# Patient Record
Sex: Male | Born: 1975 | Race: White | Hispanic: No | Marital: Married | Smoking: Never smoker
Health system: Southern US, Community
[De-identification: ages and names within clinical notes are randomized; demographics above are authoritative.]

## PROBLEM LIST (undated history)

## (undated) DIAGNOSIS — K4090 Unilateral inguinal hernia, without obstruction or gangrene, not specified as recurrent: Secondary | ICD-10-CM

## (undated) DIAGNOSIS — E785 Hyperlipidemia, unspecified: Secondary | ICD-10-CM

## (undated) HISTORY — PX: TONSILLECTOMY AND ADENOIDECTOMY: SUR1326

## (undated) HISTORY — PX: OTHER SURGICAL HISTORY: SHX169

## (undated) HISTORY — DX: Hyperlipidemia, unspecified: E78.5

---

## 2001-01-12 ENCOUNTER — Ambulatory Visit (HOSPITAL_COMMUNITY): Admission: RE | Admit: 2001-01-12 | Discharge: 2001-01-13 | Payer: Self-pay | Admitting: Orthopedic Surgery

## 2002-12-07 ENCOUNTER — Emergency Department (HOSPITAL_COMMUNITY): Admission: EM | Admit: 2002-12-07 | Discharge: 2002-12-07 | Payer: Self-pay | Admitting: Emergency Medicine

## 2004-03-12 ENCOUNTER — Inpatient Hospital Stay (HOSPITAL_COMMUNITY): Admission: AD | Admit: 2004-03-12 | Discharge: 2004-03-15 | Payer: Self-pay | Admitting: Internal Medicine

## 2004-03-12 ENCOUNTER — Encounter: Payer: Self-pay | Admitting: *Deleted

## 2005-01-31 ENCOUNTER — Encounter: Admission: RE | Admit: 2005-01-31 | Discharge: 2005-01-31 | Payer: Self-pay | Admitting: *Deleted

## 2005-06-18 ENCOUNTER — Ambulatory Visit (HOSPITAL_BASED_OUTPATIENT_CLINIC_OR_DEPARTMENT_OTHER): Admission: RE | Admit: 2005-06-18 | Discharge: 2005-06-18 | Payer: Self-pay | Admitting: Orthopedic Surgery

## 2005-11-25 IMAGING — CR DG CHEST 2V
2 series · 2 of 2 positions shown · non-contrast
Comparison: 03/13/04.

CLINICAL DATA: 28 year-old male, viral meningitis, weakness and nausea.
 CHEST-TWO VIEW:

[view not recorded (1 of 2)]
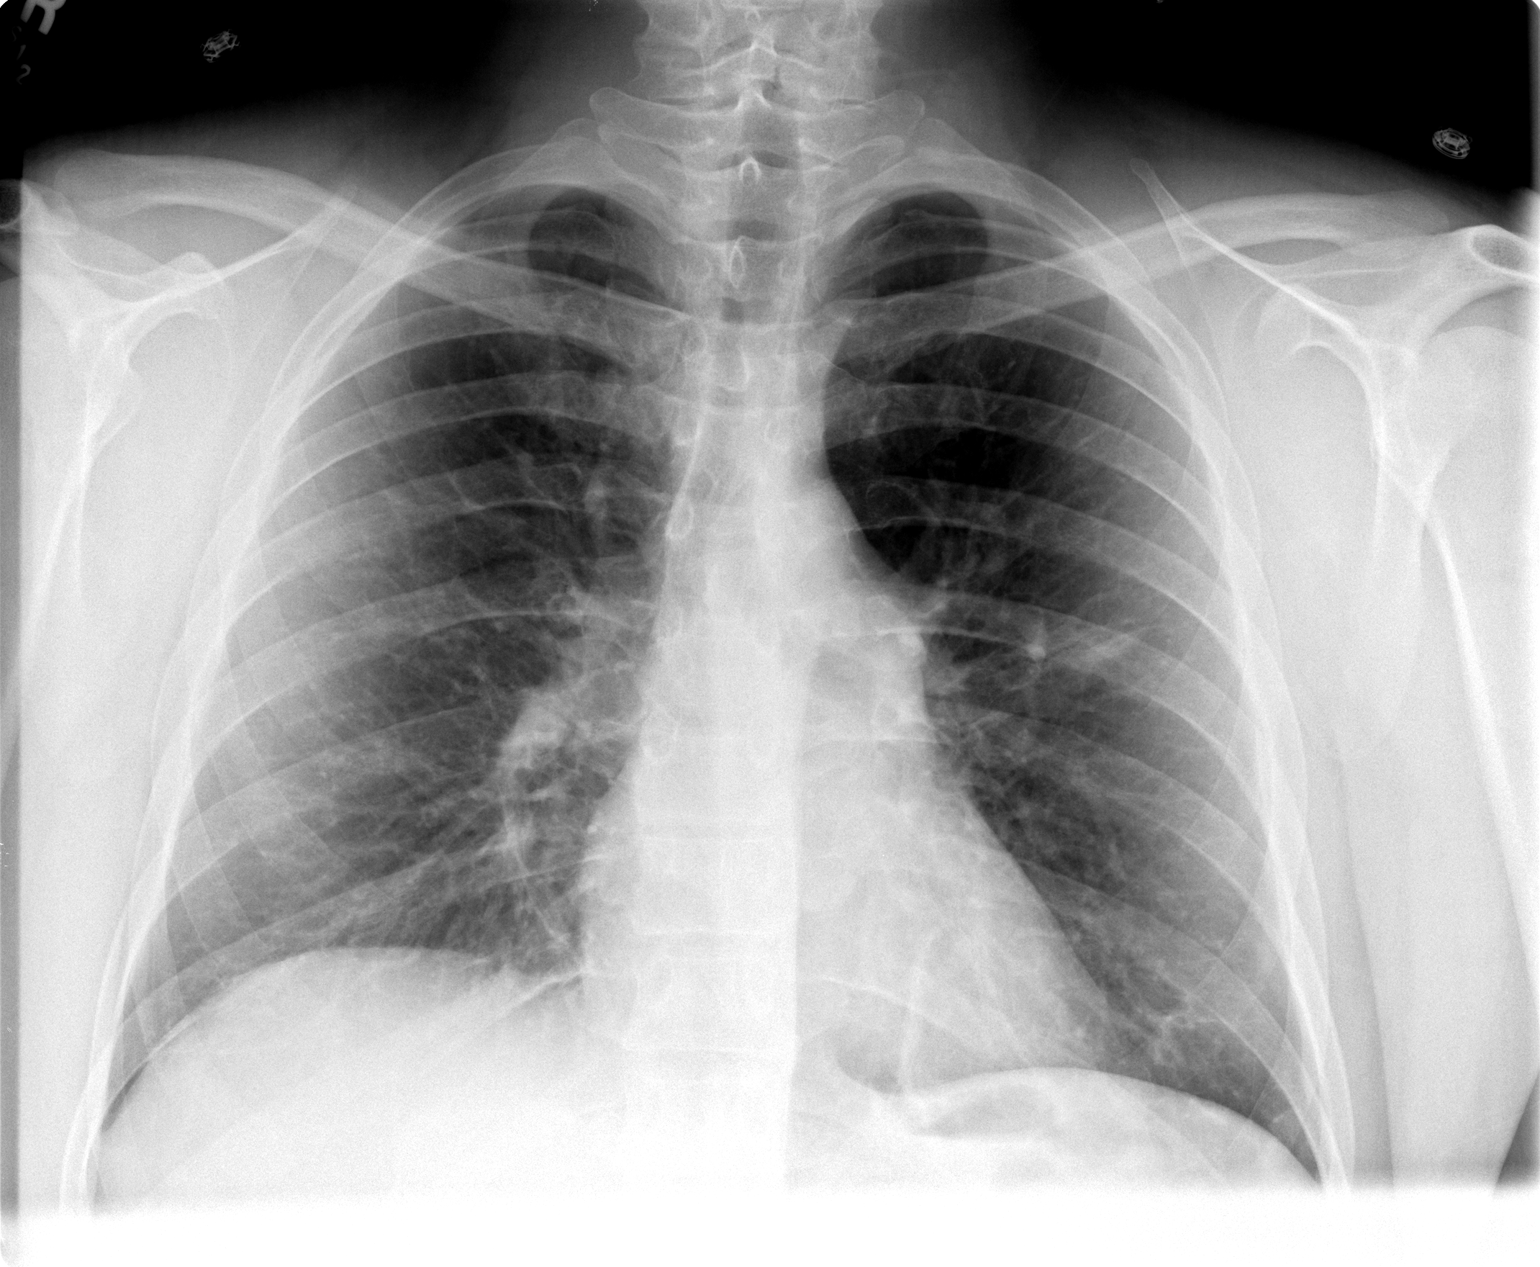

[view not recorded (2 of 2)]
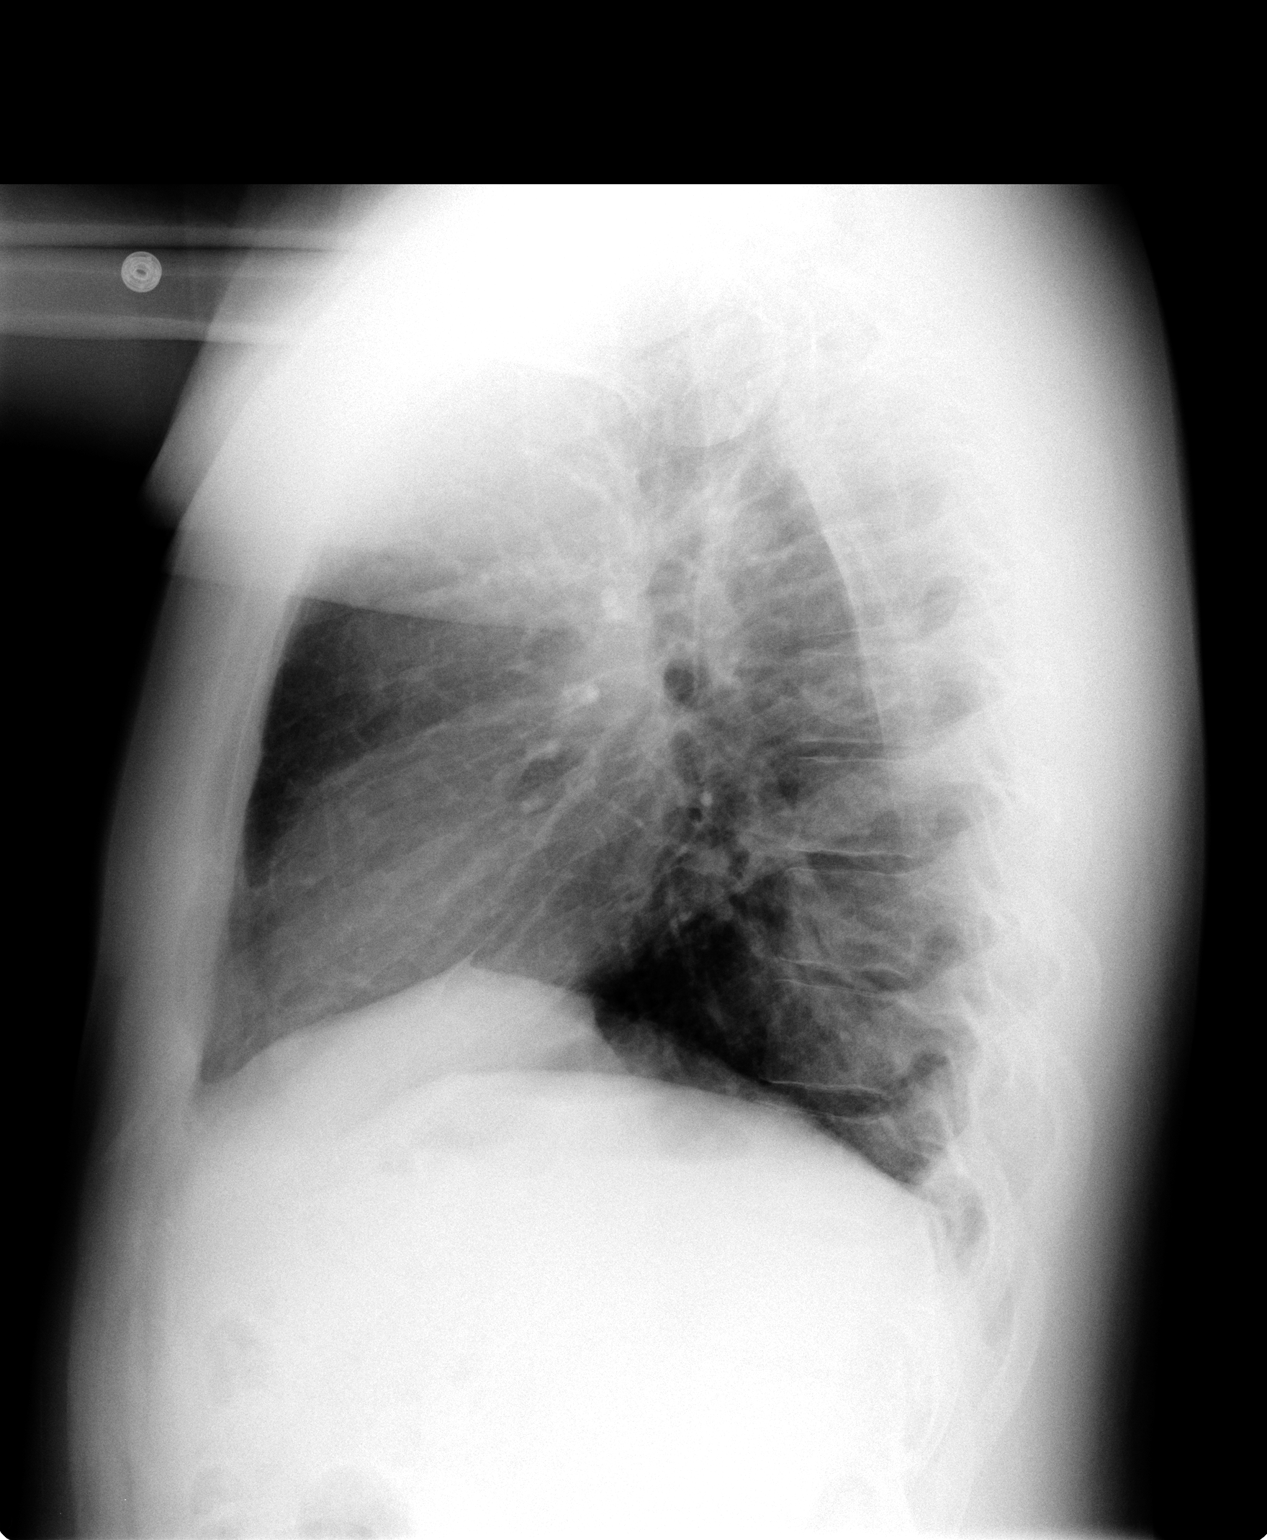

[2 of 2 positions shown; findings below may reference images not displayed]

FINDINGS: There is improved aeration of the right upper lobe and previous focal airspace disease. There is slight increase in left perihilar and left lower lobe atelectasis. No effusion, edema, or pneumothorax. Heart size is normal.
IMPRESSION: 1.  Resolving right upper lobe airspace disease.
 2.  Increasing left perihilar and lower lobe atelectasis.

## 2007-05-26 ENCOUNTER — Ambulatory Visit (HOSPITAL_BASED_OUTPATIENT_CLINIC_OR_DEPARTMENT_OTHER): Admission: RE | Admit: 2007-05-26 | Discharge: 2007-05-26 | Payer: Self-pay | Admitting: Orthopedic Surgery

## 2010-07-23 NOTE — Op Note (Signed)
Ryan Landry, Ryan Landry              ACCOUNT NO.:  [id number]   MEDICAL RECORD NO.:  [id number]          PATIENT TYPE:  AMB   LOCATION:  DSC                          FACILITY:  [facility]   PHYSICIAN:  Matthew A. Weingold, M.D.DATE OF BIRTH:  1975/07/31   DATE OF PROCEDURE:  05/26/2007  DATE OF DISCHARGE:                               OPERATIVE REPORT   PREOPERATIVE DIAGNOSIS:  Chronic right carpal tunnel syndrome.   POSTOPERATIVE DIAGNOSIS:  Chronic right carpal tunnel syndrome.   PROCEDURE:  Right carpal tunnel release.   SURGEON:  Artist Pais. Mina Marble, M.D.   ASSISTANT:  None.   ANESTHESIA:  General.   TOURNIQUET TIME:  16 minutes.   COMPLICATIONS:  None.   DRAINS:  None.   DESCRIPTION OF PROCEDURE:  The patient was taken to the operating room.  After induction of adequate general anesthesia the right upper extremity  was prepped and draped in the usual sterile fashion.  An Esmarch was  used to exsanguinate the limb.  The tourniquet was inflated to 250 mmHg.  At this point in time a 2 cm incision was made in the palmar aspect of  the right hand in line with the long finger metacarpal, starting at  Kaplan's cardinal line.  Skin was incised.  Palmar fascia was identified  and split.  The distal edge of the transverse carpal ligament was  identified, split with a 15 blade.  The median nerve was identified and  protected with a Therapist, nutritional.  Remaining aspects of the transverse  carpal ligament were then divided under direct vision using curved blunt  scissors.  The canal was inspected.  There were no osseous lesions or  ganglions present.  We irrigated and loosely closed with 3-0 Prolene  subcuticular stitch.  Steri-Strips, 4x4s, fluffs, and dry dressing was  applied.  The patient tolerated the procedure well.      Artist Pais Mina Marble, M.D.  Electronically Signed     MAW/MEDQ  D:  05/26/2007  T:  05/26/2007  Job:  [id number]

## 2010-07-26 NOTE — Op Note (Signed)
[place]  Patient:    DECORIAN, SCHUENEMANN Visit Number: [id number] MRN: [id number]          Service Type: MED Location: [id number] [phone] Attending Physician:  Burnard Bunting Dictated by:   Cammy Copa, M.D. Proc. Date: 01/12/01 Admit Date:  01/12/2001 Discharge Date: 01/13/2001                             Operative Report  PREOPERATIVE DIAGNOSIS:  Foreign body, right hand.  POSTOPERATIVE DIAGNOSIS:  Foreign body, right hand.  PROCEDURE:  Removal of foreign body and debridement of early infection.  SURGEON:  Cammy Copa, M.D.  ANESTHESIA:  General endotracheal.  ESTIMATED BLOOD LOSS:  3 cc.  DRAINS:  Vessel loop x 1.  CULTURES:  Tip of splinter is sent for culture.  TOURNIQUET TIME:  20 minutes at 250 mmHg.  PROCEDURE IN DETAIL:  This patient was brought to the operating room where general endotracheal anesthesia was induced.  The hand was prescrubbed with Hibiclens and then prepped with Hibiclens and saline in a sterile manner.  Arm was elevated but not exsanguinated, and the tourniquet was then inflated.  The entry portal of the splinter was in the second web space.  An oblique 3-cm incision was then made along the palpable course of the splinter.  Skin and subcutaneous tissue were sharply divided.  The tip of the splinter was noted to be recessed about 4-5 mm beneath the surface of the skin.  The tip of the splinter was visualized.  The skin on top of the splinter was then incised for the length of the splinter which was about 3 cm.  Careful dissection was performed.  The splinter remained superficial to the palmar fascia.  Skin and subcutaneous tissue was divided along the length of the splinter.  The neurovascular bundles were not involved nor were they at risk in the blunt dissection.  After splinter removal, the tip, which was the most penetrated end to the skin, was sent for culture.  Incision was then  thoroughly irrigated.  Early infected tissue including the skin, subcutaneous tissue, and some of the superficial fascia was debrided.  The splinter did not involve the tendon sheaths.  Following irrigation, a vessel loop was placed along the length of the incision which was then loosely reapproximated with two 3-0 nylon sutures.  The vessel loop exited out a separate hole distally and through the incision proximally.  The hand was then splinted with the second and third digits spread apart to allow for drainage in addition to the drain which was placed.  The patient was then transferred to the recovery room. Tourniquet was released, and no significant bleeding was encountered. Tourniquet was released prior to closure and drain placement, and no significant bleeding was encountered.  She was transferred to the recovery room in stable condition. Dictated by:   Cammy Copa, M.D. Attending Physician:  Burnard Bunting DD:  01/12/01 TD:  01/14/01 Job: [id number]

## 2010-07-26 NOTE — H&P (Signed)
NAMEIVON, OELKERS              ACCOUNT NO.:  [id number]   MEDICAL RECORD NO.:  [id number]          PATIENT TYPE:  EMS   LOCATION:  MINO                         FACILITY:  [facility]   PHYSICIAN:  Corinna L. Lendell Caprice, MDDATE OF BIRTH:  10/06/1975   DATE OF ADMISSION:  03/11/2004  DATE OF DISCHARGE:                                HISTORY & PHYSICAL   CHIEF COMPLAINT:  Headache.   HISTORY OF PRESENT ILLNESS:  Mr. Finch is a 35 year old white male who has  had a headache which has been worsening over the past few weeks.  He has had  subjective fevers and chills.  He has had photophobia.  He has had no  vomiting, but his appetite has been poor.  He has had no sick contacts, no  rash, no cough, and no sore throat or ear pain.   PAST MEDICAL HISTORY:  None.   MEDICATIONS:  None.   ALLERGIES:  None.   SOCIAL HISTORY:  The patient is married and has three kids.  He does not  drink, smoke, or use drugs.   FAMILY HISTORY:  Noncontributory.   REVIEW OF SYSTEMS:  As above.  Otherwise negative.   PHYSICAL EXAMINATION:  His temperature was 102.5.  The rest of his vital  signs are reportedly normal, but I do not have the nursing notes in front of  me.  Please see nursing notes for details.  In general, the patient appears  uncomfortable, lying supine on the gurney with a darkened room.  HEENT:  Normocephalic, atraumatic.  Pupils equal, round, and reactive to light.  Sclerae nonicteric.  He has minimal neck stiffness.  Oropharynx is clear.  Tympanic membranes are clear.  No lymphadenopathy.  Lungs are clear to  auscultation anteriorly without wheezes, rhonchi, or rales.  Cardiovascular:  Regular rate and rhythm without murmurs, gallops, or rubs.  Abdomen:  Soft,  nontender, and nondistended.  GU and rectal exams were deferred.  Extremities:  No clubbing, cyanosis, or edema.  Skin:  No rash.  Psychiatric:  Normal affect.  Neurologic:  Alert and oriented.  Cranial  nerves and sensory/motor  exam were intact.   LABORATORY DATA:  CBC was significant for a hemoglobin of 18 and hematocrit  of 53, otherwise, unremarkable.  Basic metabolic panel was normal.  CSF was  clear, colorless with 31 white cells with 72% lymphocytes, 13% neutrophils,  15% monocytes, 0 eosinophils, and 2 red cells.  Protein in the CSF was 55  and glucose 49.  Gram stain showed white cells and no organisms.  CT of the  brain was negative.   ASSESSMENT AND PLAN:  Viral meningitis.  The patient has had significant  pain, and this has worsened over the past 2 weeks.  His appetite is poor,  and he cannot even lift his head due to the pain.  I think it would be  reasonable to admit him for symptomatic control, including IV fluids, pain  medications, and antiemetics as needed.      Cori   CLS/MEDQ  D:  03/12/2004  T:  03/12/2004  Job:  [id number]  cc:   Elana Alm. Nicholos Johns, M.D.  [address]  [place]  [place] [address]  Fax: [phone]

## 2010-07-26 NOTE — Op Note (Signed)
NAMEBEACHER, EVERY              ACCOUNT NO.:  [id number]   MEDICAL RECORD NO.:  [id number]          PATIENT TYPE:  AMB   LOCATION:  DSC                          FACILITY:  [facility]   PHYSICIAN:  Matthew A. Weingold, M.D.DATE OF BIRTH:  1975-03-29   DATE OF PROCEDURE:  06/18/2005  DATE OF DISCHARGE:                                 OPERATIVE REPORT   PREOPERATIVE DIAGNOSIS:  Right radial tunnel syndrome.   POSTOPERATIVE DIAGNOSIS:  Right radial tunnel syndrome.   PROCEDURE:  Right radial tunnel release.   SURGEON:  Artist Pais. Mina Marble, M.D.   ASSISTANT:  None.   ANESTHESIA:  General.   TOURNIQUET TIME:  45 min.   COMPLICATIONS:  None.   OPERATIVE REPORT:  The patient was taken to operating room, where after the  induction of adequate general anesthesia the right upper extremity was  prepped and draped in sterile fashion.  An Esmarch was used to exsanguinate  the limb.  The tourniquet was then inflated to 250 mmHg.  At this point and  time an incision was made on the dorsal radial side of the right forearm.  At the level of the lateral epicondyle about 4-5 cm anterior to that, the  skin was incised longitudinally and dissection was carried down between the  extensor muscles, until the interval between the wad of Henry in the  extensors was identified and split.  The radial nerve was identified at its  bifurcation, and the superficial and deep branches there were a couple of  large vessels that were carefully tied off using 3-0 Vicryl ties, they were  not putting pressure upon the nerve.  The nerve was traced into the edge of  the supinator muscle, where a sharp fibrous band was incised for a distance  of 2.5-3.0 cm, decompressing the nerve.  After this was done, all bleeding  points were cauterized.  The wound was then thoroughly irrigated and  hemostasis was achieved with bipolar cautery.  It was then again irrigated  and loosely closed with 3-0 Prolene subcuticular stitch.   Steri-Strips, 4x4  fluffs and a compressive dressing was applied.  The patient tolerated the  procedure well and went to the recovery room in satisfactory condition.      Artist Pais Mina Marble, M.D.  Electronically Signed     MAW/MEDQ  D:  06/18/2005  T:  06/18/2005  Job:  [id number]

## 2010-07-26 NOTE — Discharge Summary (Signed)
Ryan Landry, Ryan Landry              ACCOUNT NO.:  [id number]   MEDICAL RECORD NO.:  [id number]          PATIENT TYPE:  INP   LOCATION:  0444                         FACILITY:  [facility]   PHYSICIAN:  Deirdre Peer. Polite, M.D. DATE OF BIRTH:  [birth year]   DATE OF ADMISSION:  03/12/2004  DATE OF DISCHARGE:                                 DISCHARGE SUMMARY   DISCHARGE DIAGNOSES:  1.  Viral meningitis.  Patient is status post lumbar puncture.  Cytology      negative for bacteria.  Recommend continuing conservative treatment.  2.  Right upper lobe pneumonia, improved on follow-up x-rays.  3.  Headache secondary to #1.  4.  Fever secondary to #1.  5.  Malaise, improved at the time of discharge.   DISCHARGE MEDICATIONS:  1.  Ketek 800 mg 1 p.o. q.d. x10 days.  2.  Percocet 1-2 tabs q.4-6h. p.r.n.   DISPOSITION:  Patient is being discharged home in stable condition, asked to  follow up with primary MD in approximately 1-2 weeks.  At that time,  recommend follow-up x-ray.   CONSULTS:  None.   STUDIES:  Patient underwent lumbar puncture.  Culture negative.  Blood  culture x2 negative.  CSF Gram's stain:  White blood cells present but no  organism.  CSF cytology showed 31 white cells, 72% lymphs, 13% neutrophils,  clear, colorless.  Protein and CSF was 55.  Glucose 49.  Gram's stain:  White cells.  No organism.   CT of the brain negative.   CBC and BMET within normal limits.   HISTORY OF PRESENT ILLNESS:  A 35 year old male with no significant past  medical history presents to the ED after having protracted headache with  associated fever.  In addition, patient experienced photophobia.  Patient  denied any emesis.  Denied any sick contacts or rash.  In the ED, the  patient was evaluated.  The patient underwent an LP.  The patient was  admitted to the hospital for evaluation and treatment of headache, fever,  and viral meningitis.  Please see dictated H&P for further details.   PAST  MEDICAL HISTORY:  None.   MEDICATIONS:  None.   ALLERGIES:  None.   SOCIAL HISTORY:  Negative for alcohol, tobacco or drugs.   FAMILY HISTORY:  Noncontributory.   HOSPITAL COURSE:  Patient was admitted to a medicine floor bed for  evaluation and treatment of viral meningitis.  Patient was treated  symptomatically with analgesia, antipyretic.  As stated, patient had an LP.  Cultures were followed up, and at this time are still negative.  In order to  complete the patient's evaluation for fever, the patient underwent further  studies.  The following day had chest x-ray and blood cultures.  Patient's  chest x-ray showed an infiltrate in the right upper lobe; therefore, the  patient was started on antibiotics for community-acquired pneumonia.  In the  interim, the patient was continued with conservative treatment for viral  meningitis.  Patient had defervescence of his fever and improvement in  headache symptoms.  Throughout this hospitalization, the patient did not  have any  trouble with any cough or productive sputum.  Patient will continue  a course of antibiotics for her pneumonia x10 days.  At this time, the  patient is ambulating in the hall and eating, ready to be discharged to  home.  Patient will follow up with his primary MD in approximately 1-2 weeks  for further outpatient evaluation, i.e., follow-up chest x-ray.  At this  time, the patient is stable for discharge.      RDP/MEDQ  D:  03/15/2004  T:  03/15/2004  Job:  [id number]   cc:   Molly Maduro A. Nicholos Johns, M.D.  [address]  [place]  [place] [address]  Fax: [phone]

## 2010-12-02 LAB — POCT HEMOGLOBIN-HEMACUE: Hemoglobin: 15.5

## 2017-03-24 ENCOUNTER — Emergency Department (HOSPITAL_COMMUNITY)
Admission: EM | Admit: 2017-03-24 | Discharge: 2017-03-24 | Disposition: A | Discharge status: Home / Self Care | Payer: Worker's Compensation | Attending: Emergency Medicine | Admitting: Emergency Medicine

## 2017-03-24 ENCOUNTER — Other Ambulatory Visit: Payer: Self-pay

## 2017-03-24 ENCOUNTER — Emergency Department (HOSPITAL_COMMUNITY): Payer: Worker's Compensation

## 2017-03-24 ENCOUNTER — Encounter (HOSPITAL_COMMUNITY): Payer: Self-pay

## 2017-03-24 DIAGNOSIS — Y999 Unspecified external cause status: Secondary | ICD-10-CM | POA: Insufficient documentation

## 2017-03-24 DIAGNOSIS — Y9389 Activity, other specified: Secondary | ICD-10-CM | POA: Insufficient documentation

## 2017-03-24 DIAGNOSIS — S61319A Laceration without foreign body of unspecified finger with damage to nail, initial encounter: Secondary | ICD-10-CM | POA: Diagnosis not present

## 2017-03-24 DIAGNOSIS — Z23 Encounter for immunization: Secondary | ICD-10-CM | POA: Insufficient documentation

## 2017-03-24 DIAGNOSIS — W208XXA Other cause of strike by thrown, projected or falling object, initial encounter: Secondary | ICD-10-CM | POA: Insufficient documentation

## 2017-03-24 DIAGNOSIS — Y929 Unspecified place or not applicable: Secondary | ICD-10-CM | POA: Diagnosis not present

## 2017-03-24 DIAGNOSIS — S6992XA Unspecified injury of left wrist, hand and finger(s), initial encounter: Secondary | ICD-10-CM | POA: Diagnosis present

## 2017-03-24 MED ORDER — LIDOCAINE HCL 2 % IJ SOLN
20.0000 mL | Freq: Once | INTRAMUSCULAR | Status: AC
  Administered 2017-03-24: 400 mg
  Filled 2017-03-24: qty 20

## 2017-03-24 MED ORDER — ONDANSETRON HCL 4 MG/2ML IJ SOLN
4.0000 mg | Freq: Once | INTRAMUSCULAR | Status: AC
  Administered 2017-03-24: 4 mg via INTRAVENOUS
  Filled 2017-03-24: qty 2

## 2017-03-24 MED ORDER — OXYCODONE-ACETAMINOPHEN 5-325 MG PO TABS
2.0000 | ORAL_TABLET | Freq: Once | ORAL | Status: AC
  Administered 2017-03-24: 2 via ORAL
  Filled 2017-03-24: qty 2

## 2017-03-24 MED ORDER — MORPHINE SULFATE (PF) 4 MG/ML IV SOLN
4.0000 mg | Freq: Once | INTRAVENOUS | Status: AC
  Administered 2017-03-24: 4 mg via INTRAVENOUS
  Filled 2017-03-24: qty 1

## 2017-03-24 MED ORDER — TETANUS-DIPHTH-ACELL PERTUSSIS 5-2.5-18.5 LF-MCG/0.5 IM SUSP
0.5000 mL | Freq: Once | INTRAMUSCULAR | Status: AC
  Administered 2017-03-24: 0.5 mL via INTRAMUSCULAR
  Filled 2017-03-24: qty 0.5

## 2017-03-24 NOTE — ED Triage Notes (Signed)
Metal shelf fell onto left ring and middle finger while at work lacerating through fingernail.

## 2017-03-24 NOTE — Consult Note (Signed)
Reason for Consult:left hand crush injury Referring Physician: ER staff  Ryan CassetteJessie Landry is an 42 y.o. male.  HPI: 42 mL status post crushing injury left middle and ring finger with exposed bone and nailbed and nail plate injury. Patient was seen in triage. He presents for my evaluation. I reviewed his x-rays his symptoms and the presenting features.  He denies neck back chest abdominal pain.  History reviewed. No pertinent past medical history.  History reviewed. No pertinent surgical history.  No family history on file.  Social History:  reports that  has never smoked. he has never used smokeless tobacco. He reports that he does not drink alcohol or use drugs.  Allergies: Not on File  Medications: I have reviewed the patient's current medications.  No results found for this or any previous visit (from the past 48 hour(s)).  Dg Hand Complete Left  Result Date: 03/24/2017 CLINICAL DATA:  Pt c/o distal left hand pain and lacerations after a metal shelf fell on his hand and cut his 3rd and 4th fingers at work. Gauze was left on the patient's hand due to excessive bleeding. No hx of prior injuries. Pt was unable to perform the fan lateral image. EXAM: LEFT HAND - COMPLETE 3+ VIEW COMPARISON:  None. FINDINGS: There is soft tissue injuries to the tips of the third and fourth fingers. There is a small nondisplaced fracture of the distal tuft of the middle finger. There is a probable tiny fracture of the distal tuft of the fourth finger. No other fractures. Joints are normally aligned. There are no radiopaque foreign bodies. IMPRESSION: 1. Small nondisplaced fracture of the distal tuft of the third finger with a possible tiny nondisplaced fracture of the distal tuft of the fourth finger. 2. No other fractures.  No dislocation.  No radiopaque foreign body. Electronically Signed   By: Ryan Landry  Landry M.D.   On: 03/24/2017 19:41    Review of Systems  Eyes: Negative.   Respiratory: Negative.    Cardiovascular: Negative.   Gastrointestinal: Negative.   Genitourinary: Negative.    Blood pressure 125/77, pulse 77, temperature 98 F (36.7 C), temperature source Oral, resp. rate 16, SpO2 97 %. Physical Exam The patient is alert and oriented in no acute distress. The patient complains of pain in the affected upper extremity.  The patient is noted to have a normal HEENT exam. Lung fields show equal chest expansion and no shortness of breath. Abdomen exam is nontender without distention. Lower extremity examination does not show any fracture dislocation or blood clot symptoms. Pelvis is stable and the neck and back are stable and nontender.   Nail bed nail plate and open distal phalanx fractures of the left middle and ring finger with disarray the soft tissue.  He and I reviewed these issues at length. He will need irrigation debridement and surgical repairs     Assessment/Plan: We'll plan for surgical intervention patient understands risks and benefits and desires to proceed.  We are planning surgery for your upper extremity. The risk and benefits of surgery to include risk of bleeding, infection, anesthesia,  damage to normal structures and failure of the surgery to accomplish its intended goals of relieving symptoms and restoring function have been discussed in detail. With this in mind we plan to proceed. I have specifically discussed with the patient the pre-and postoperative regime and the dos and don'ts and risk and benefits in great detail. Risk and benefits of surgery also include risk of dystrophy(CRPS), chronic nerve pain,  failure of the healing process to go onto completion and other inherent risks of surgery The relavent the pathophysiology of the disease/injury process, as well as the alternatives for treatment and postoperative course of action has been discussed in great detail with the patient who desires to proceed.  We will do everything in our power to help you  (the patient) restore function to the upper extremity. It is a pleasure to see this patient today.  03/24/2017  9:32 PM  PATIENT:  Ryan Landry    PRE-OPERATIVE DIAGNOSIS:  Left middle and ring finger open distal phalanx fractures with nailbed injury and nailplate injury  POST-OPERATIVE DIAGNOSIS:  Same  PROCEDURE:  #1 irrigation debridement open fractures left middle finger distal phalanx and left ring finger distal phalanx. This was a surgical I and D excisional debridement of bone and soft tissue with curette knife and scissor #2 nail plate removal left middle finger #3 open treatment distal phalanx fracture left middle finger #4 nailbed repair complex in nature left middle finger #5 open treatment distal phalanx fracture left ring finger #6 nail plate removal left ring finger #7 nailbed repair left ring finger  SURGEON:  Ryan Cohn III, MD  PHYSICIAN ASSISTANT: None  ANESTHESIA:   Local lidocaine block  PREOPERATIVE INDICATIONS:  Ryan Landry is a  42 y.o. male with a diagnosis of   The risks benefits and alternatives were discussed with the patient preoperatively including but not limited to the risks of infection, bleeding, nerve injury, cardiopulmonary complications, the need for revision surgery, among others, and the patient was willing to proceed.   OPERATIVE PROCEDURE: Patient was seen sterilely prepped and draped in the sterile fashion after a intermetacarpal block was performed with lidocaine without epinephrine. Patient then had nail plate removed with Ryan Landry. Following this the patient went irrigation debridement and open fracture about the distal phalanx. This was an excisional debridement of a open fracture performed with curette knife blade and scissor. Following this patient underwent setting technique of the distal phalanx with open treatment of distal phalanx fracture. Following this the patient underwent meticulous repair of the nail bed  with 4-0 loupe magnification and 6-0 chromic suture for the complex repair. Next the lateral nail fold was repaired. Following this Adaptic was placed under the eponychial fold to prevent nailbed adherent and the patient was dressed sterilely. This was performed for the left middle finger followed by an identical procedure for left ring finger. This consisted of nail plate removal followed by irrigation and debridement of open fracture followed by open treatment of the bony fracture followed by nailbed repair with 6-0 chromic suture. The patient tolerated this well number no comp K features.  Thus the patient underwent irrigation and debridement of an open fracture no plate removal, nailbed repair, open treatment of distal phalanx fracture of the ring and middle finger.  Wounds were dressed sterilely there were no comp cutting features.  Bactrim DS for 14 days 1 by mouth twice a day and OxyIR when necessary pain  I discussed him all issues do's and don'ts.  We'll see him in 8 days for wound check and tip protectors with therapy and in 14 days from I evaluation. I discussed him all issues. He requested light work duty. I wrote a note for light work duty but told him that if he cannot return to work due to pain he should notify my office immediately.  Keep bandage clean and dry.  Call for any problems.  No smoking.  Criteria for driving a car: you should be off your pain medicine for 7-8 hours, able to drive one handed(confident), thinking clearly and feeling able in your judgement to drive. Continue elevation as it will decrease swelling.  If instructed by MD move your fingers within the confines of the bandage/splint.  Use ice if instructed by your MD. Call immediately for any sudden loss of feeling in your hand/arm or change in functional abilities of the extremity.  We recommend that you to take vitamin C 1000 mg a day to promote healing. We also recommend that if you require  pain medicine that  you take a stool softener to prevent constipation as most pain medicines will have constipation side effects. We recommend either Peri-Colace or Senokot and recommend that you also consider adding MiraLAX as well to prevent the constipation affects from pain medicine if you are required to use them. These medicines are over the counter and may be purchased at a local pharmacy. A cup of yogurt and a probiotic can also be helpful during the recovery process as the medicines can disrupt your intestinal environment.  Ryan Landry 03/24/2017, 9:30 PM

## 2017-03-24 NOTE — Discharge Instructions (Signed)
Please read attached information. If you experience any new or worsening signs or symptoms please return to the emergency room for evaluation. Please follow-up with your primary care provider or specialist as discussed. Please use medication prescribed only as directed and discontinue taking if you have any concerning signs or symptoms.   °

## 2017-03-24 NOTE — ED Provider Notes (Signed)
[facility]   CSN: [id number] Arrival date & time: 03/24/17  1757    History   Chief Complaint No chief complaint on file.   HPI Ryan Landry is a 42 y.o. male.  HPI    42 year old male presents today with complaints of laceration.  Patient notes he was working with metal shelving that slipped fell and landed on his left ring and middle finger causing a laceration through the fingernails.  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  He notes his last p.o. intake was around 12:00 today eating an apple.  His tetanus status is unknown.  He denies any other injuries.  Patient with no significant past medical history.     History reviewed. No pertinent past medical history.  There are no active problems to display for this patient.   History reviewed. No pertinent surgical history.     Home Medications    Prior to Admission medications   Not on File    Family History No family history on file.  Social History Social History   Tobacco Use  . Smoking status: Never Smoker  . Smokeless tobacco: Never Used  Substance Use Topics  . Alcohol use: No    Frequency: Never  . Drug use: No     Allergies   Patient has no allergy information on record.   Review of Systems Review of Systems  All other systems reviewed and are negative.    Physical Exam Updated Vital Signs BP 116/79   Pulse 76   Temp 98 F (36.7 C) (Oral)   Resp 16   SpO2 97%   Physical Exam  Constitutional: He is oriented to person, place, and time. He appears well-developed and well-nourished.  HENT:  Head: Normocephalic and atraumatic.  Eyes: Conjunctivae are normal. Pupils are equal, round, and reactive to light. Right eye exhibits no discharge. Left eye exhibits no discharge. No scleral icterus.  Neck: Normal range of motion. No JVD present. No tracheal deviation present.  Pulmonary/Chest: Effort normal. No stridor.    Neurological: He is alert and oriented to person, place, and time. Coordination normal.  Psychiatric: He has a normal mood and affect. His behavior is normal. Judgment and thought content normal.  Nursing note and vitals reviewed.        ED Treatments / Results  Labs (all labs ordered are listed, but only abnormal results are displayed) Labs Reviewed - No data to display  EKG  EKG Interpretation None       Radiology Dg Hand Complete Left  Result Date: 03/24/2017 CLINICAL DATA:  Pt c/o distal left hand pain and lacerations after a metal shelf fell on his hand and cut his 3rd and 4th fingers at work. Gauze was left on the patient's hand due to excessive bleeding. No hx of prior injuries. Pt was unable to perform the fan lateral image. EXAM: LEFT HAND - COMPLETE 3+ VIEW COMPARISON:  None. FINDINGS: There is soft tissue injuries to the tips of the third and fourth fingers. There is a small nondisplaced fracture of the distal tuft of the middle finger. There is a probable tiny fracture of the distal tuft of the fourth finger. No other fractures. Joints are normally aligned. There are no radiopaque foreign bodies. IMPRESSION: 1. Small nondisplaced fracture of the distal tuft of the third finger with a possible tiny nondisplaced fracture of the distal tuft of the fourth finger. 2. No other fractures.  No dislocation.  No radiopaque foreign body. Electronically Signed   By: Amie Portland M.D.   On: 03/24/2017 19:41    Procedures Procedures (including critical care time)  Medications Ordered in ED Medications  morphine 4 MG/ML injection 4 mg (4 mg Intravenous Given 03/24/17 2000)  Tdap (BOOSTRIX) injection 0.5 mL (0.5 mLs Intramuscular Given 03/24/17 2000)  lidocaine (XYLOCAINE) 2 % (with pres) injection 400 mg (400 mg Infiltration Given 03/24/17 2000)  ondansetron (ZOFRAN) injection 4 mg (4 mg Intravenous Given 03/24/17 2000)  lidocaine (XYLOCAINE) 2 % (with pres) injection 400 mg (400 mg  Infiltration Given 03/24/17 2010)     Initial Impression / Assessment and Plan / ED Course  I have reviewed the triage vital signs and the nursing notes.  Pertinent labs & imaging results that were available during my care of the patient were reviewed by me and considered in my medical decision making (see chart for details).     Final Clinical Impressions(s) / ED Diagnoses   Final diagnoses:  Laceration of finger of left hand without foreign body with damage to nail, unspecified finger, initial encounter    Labs:   Imaging: DG hand complete  Consults:  Therapeutics: Tdap, morphine  Discharge Meds:   Assessment/Plan: 42 year old male with a laceration through the fingernail and nail beds.  Also acute fracture.  Hand surgery consulted to evaluate the patient had a bedside for repair.  Tetanus updated awaiting Driscilla Grammes consultation    ED Discharge Orders    None       Rosalio Loud 03/24/17 2103    Arby Barrette, MD 04/02/17 [phone]

## 2018-12-04 IMAGING — CR DG HAND COMPLETE 3+V*L*
3 series · 3 of 3 positions shown · non-contrast
Comparison: None.

CLINICAL DATA: Pt c/o distal left hand pain and lacerations after a
metal shelf fell on his hand and cut his 3rd and 4th fingers at
work. Gauze was left on the patient's hand due to excessive
bleeding. No hx of prior injuries. Pt was unable to perform the fan
lateral image.

EXAM:
LEFT HAND - COMPLETE 3+ VIEW

[hand pa]
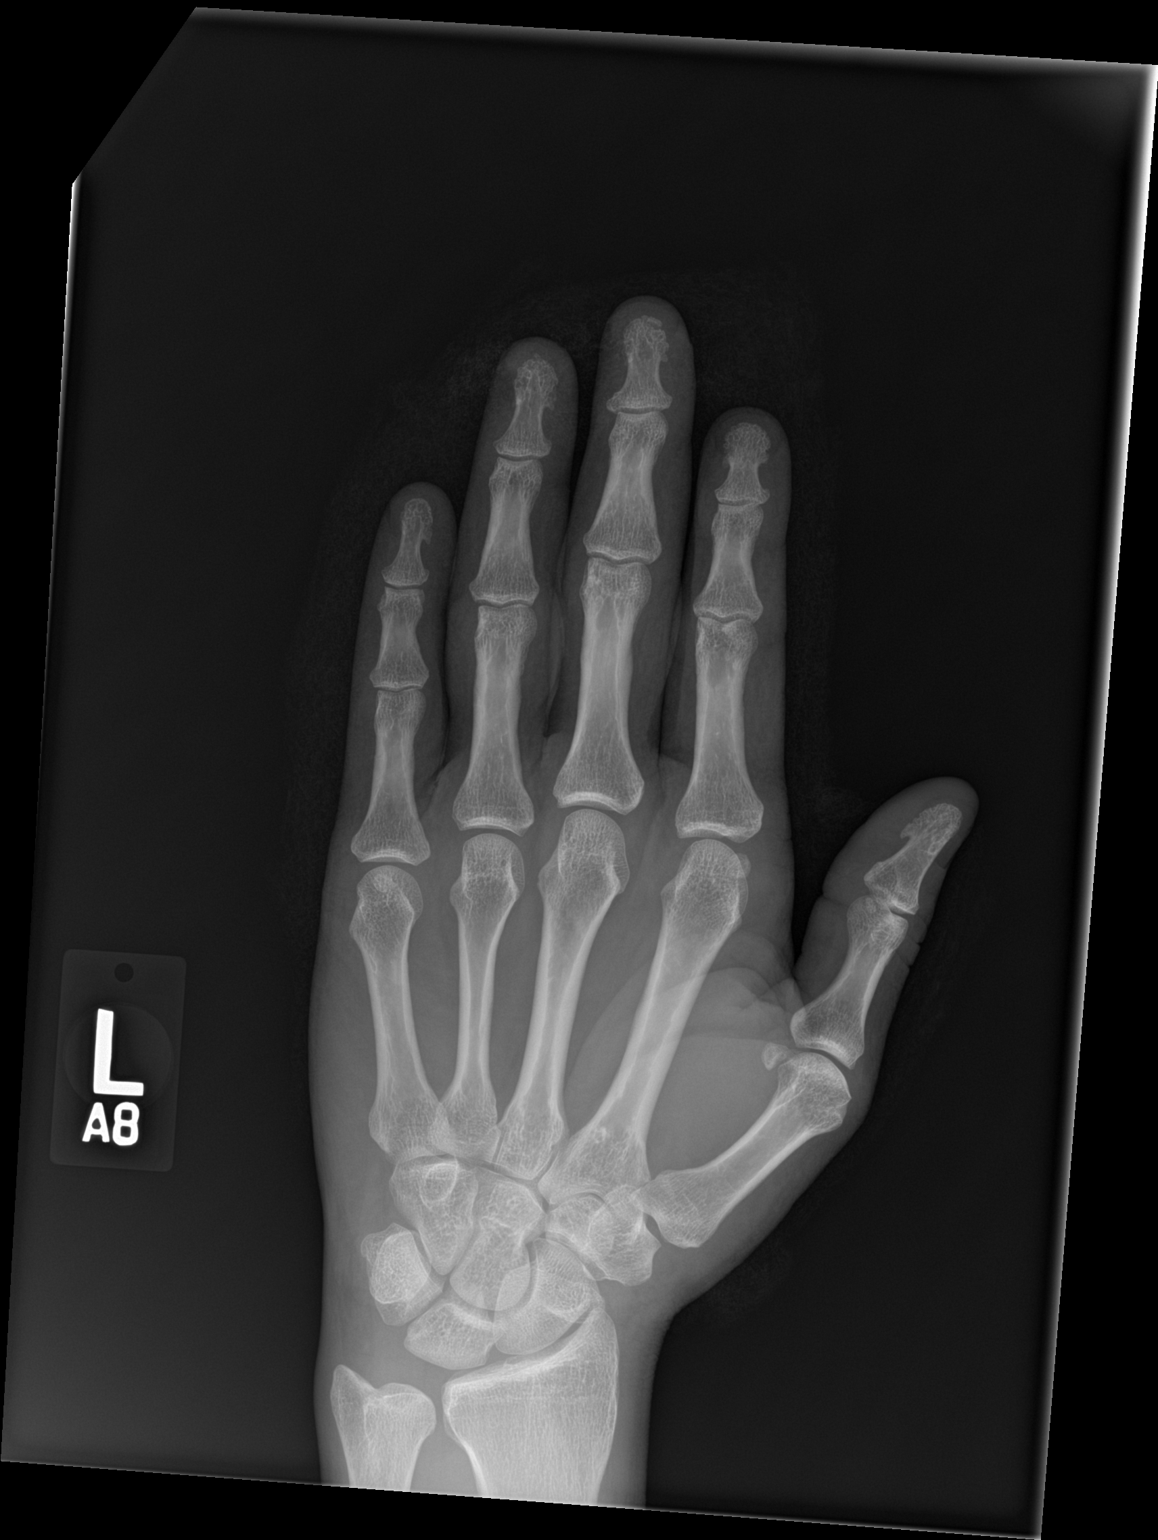

[hand obl]
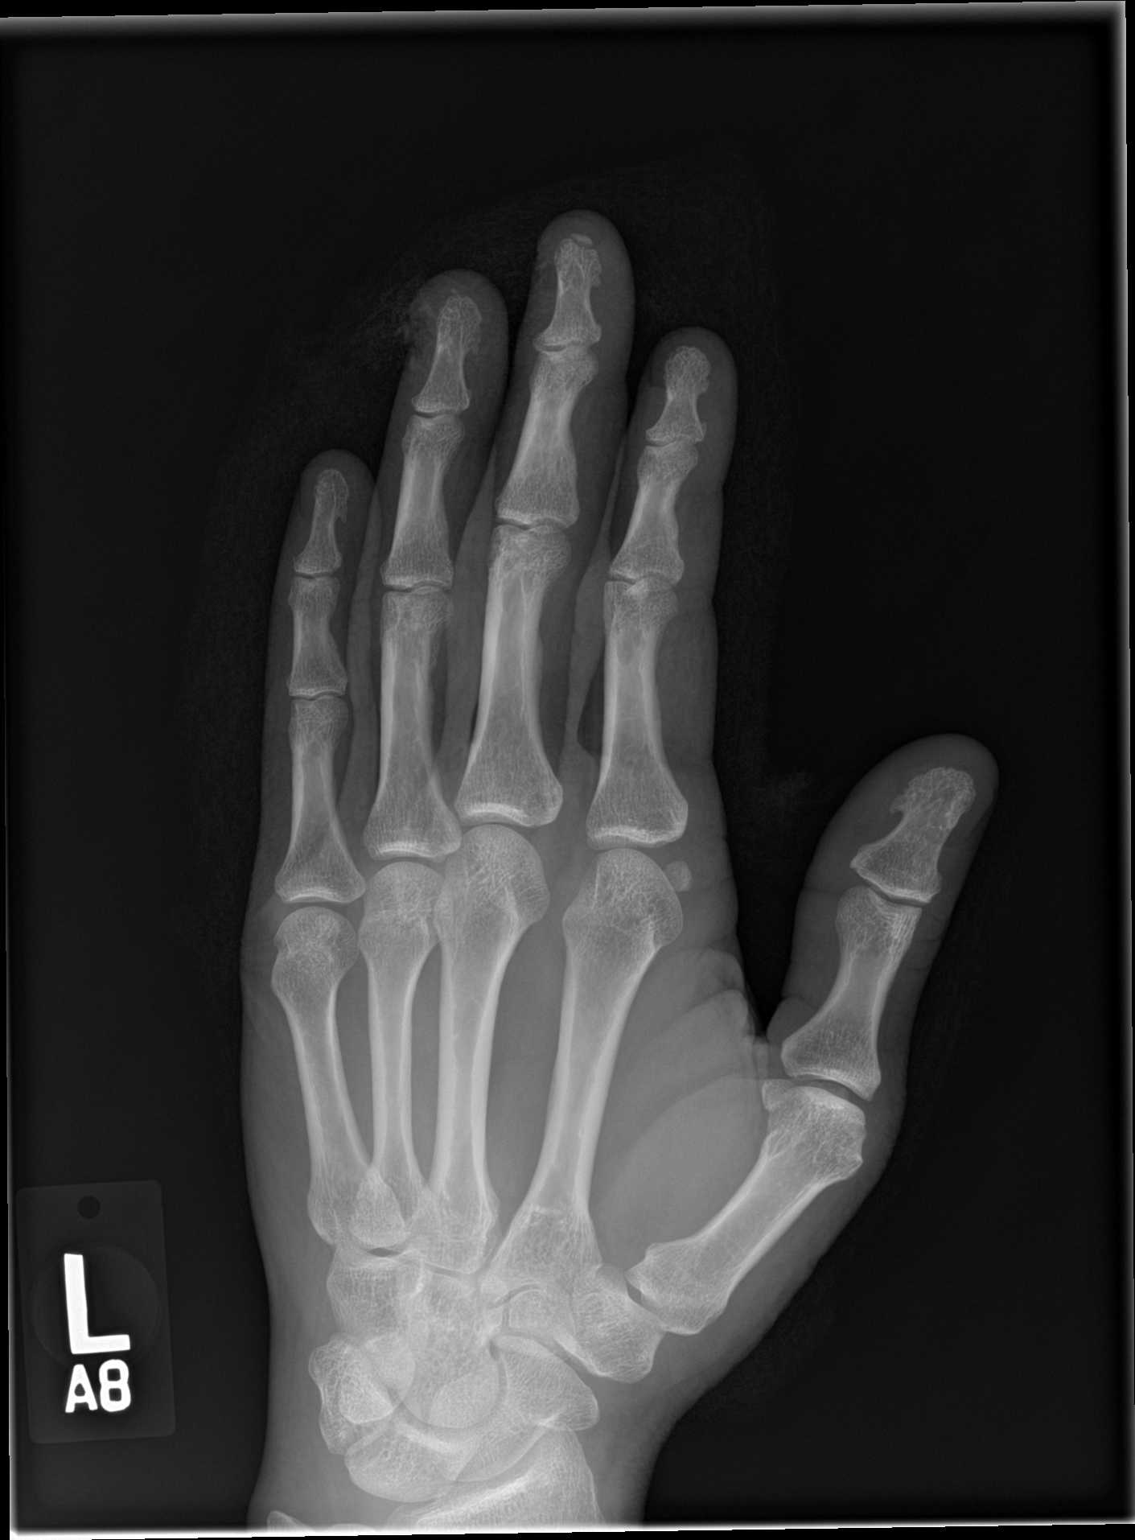

[hand lat]
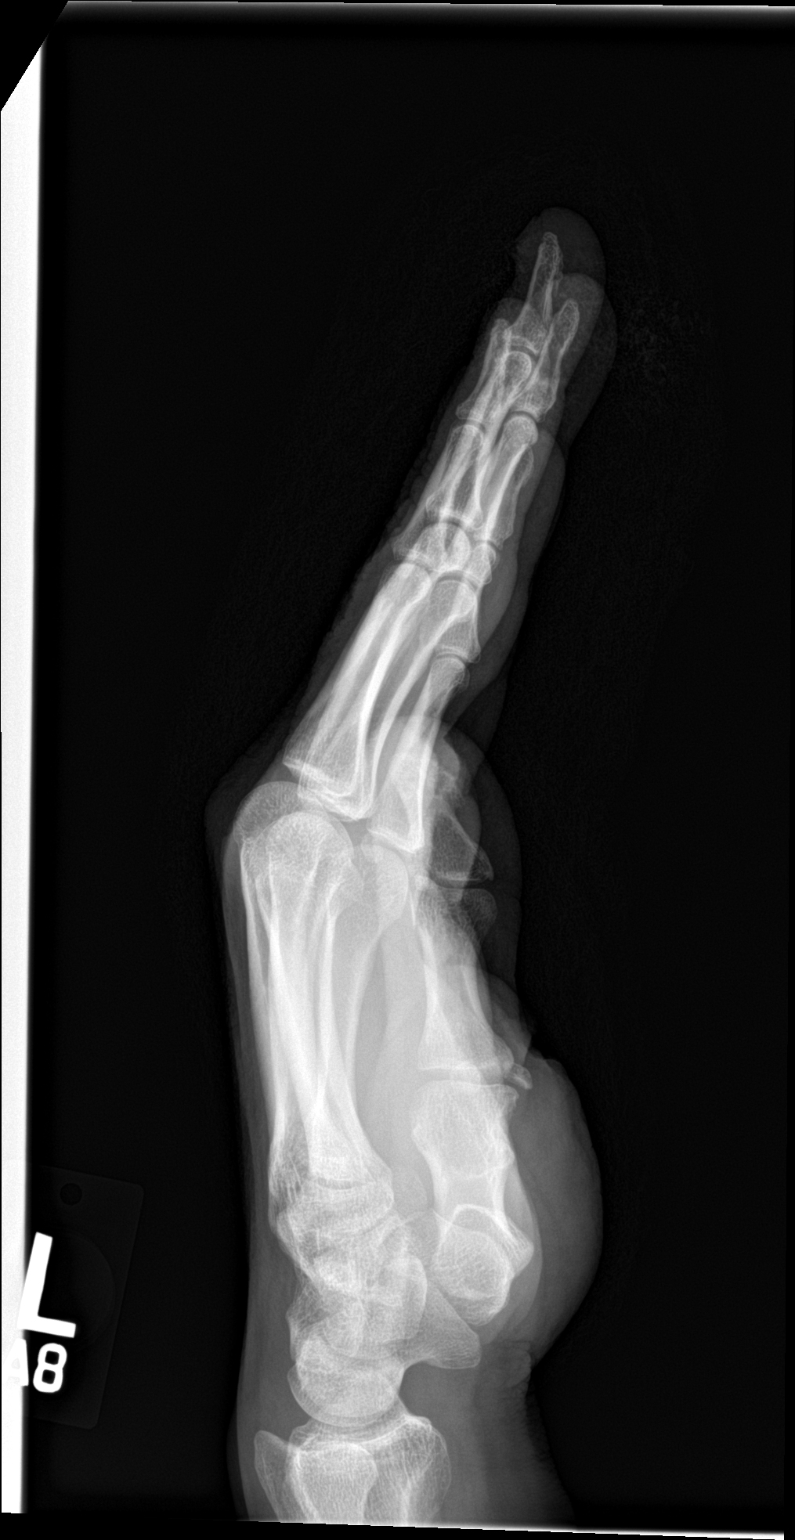

[3 of 3 positions shown; findings below may reference images not displayed]

FINDINGS: There is soft tissue injuries to the tips of the third and fourth
fingers. There is a small nondisplaced fracture of the distal tuft
of the middle finger. There is a probable tiny fracture of the
distal tuft of the fourth finger. No other fractures.

Joints are normally aligned.

There are no radiopaque foreign bodies.
IMPRESSION: 1. Small nondisplaced fracture of the distal tuft of the third
finger with a possible tiny nondisplaced fracture of the distal tuft
of the fourth finger.
2. No other fractures.  No dislocation.  No radiopaque foreign body.

## 2019-01-25 ENCOUNTER — Telehealth: Payer: Self-pay | Admitting: Cardiology

## 2019-01-25 ENCOUNTER — Telehealth: Payer: Self-pay

## 2019-01-25 NOTE — Telephone Encounter (Signed)
Advised wife secondary to [place] could call during visit however she could not accompany him, verbalized understanding.

## 2019-01-25 NOTE — Telephone Encounter (Signed)
Patient has a new pt appt tomorrow with Dr. Percival Spanish at 9:40am. Patient's wife is requesting to come with. I did state that we were only allowing care-givers at this time. She stated this being his first appt she does have some questions she would like to discuss with the doctor.

## 2019-01-25 NOTE — Progress Notes (Signed)
Cardiology Office Note   Date:  01/26/2019   ID:  Ryan Landry, DOB Aug 08, 1975, MRN [id number]  PCP:  System, Pcp Not In  Cardiologist:   No primary care provider on file. Referring:  Alan Ripper, [place]  Chief Complaint  Patient presents with  . Abnormal ECG      History of Present Illness: Ryan Landry is a 43 y.o. male who is referred by Alan Ripper, PA for evaluation of an abnormal EKG. the patient has no past cardiac history.  He is never had any prior cardiac testing other than an EKG years ago.  He had 2 EKGs recently and was told they were abnormal.  I was able to get a copy of the two EKGs from the primary care office.  He had poor anterior R wave progression.  However, this was not evident on the EKG in our office.   He denies any cardiovascular symptoms.  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 The patient denies any new symptoms such as chest discomfort, neck or arm discomfort. There has been no new shortness of breath, PND or orthopnea. There have been no reported palpitations, presyncope or syncope.  Past Medical History:  Diagnosis Date  . Dyslipidemia     Past Surgical History:  Procedure Laterality Date  . Arm surgery    . TONSILLECTOMY AND ADENOIDECTOMY       No current outpatient medications on file.   No current facility-administered medications for this visit.     Allergies:   Patient has no known allergies.    Social History:  The patient  reports that he has never smoked. He has never used smokeless tobacco. He reports that he does not drink alcohol or use drugs.   Family History:  The patient's family history includes COPD in his mother; Prostate cancer in his father.    ROS:  Please see the history of present illness.   Otherwise, review of systems are positive for none.   All other systems are reviewed and negative.    PHYSICAL EXAM: VS:  BP 123/79   Pulse 91   Temp 98.4 F (36.9 C)   Ht 6\' 2"  (1.88 m)   Wt 221 lb 9.6 oz  (100.5 kg)   SpO2 98%   BMI 28.45 kg/m  , BMI Body mass index is 28.45 kg/m. GENERAL:  Well appearing HEENT:  Pupils equal round and reactive, fundi not visualized, oral mucosa unremarkable NECK:  No jugular venous distention, waveform within normal limits, carotid upstroke brisk and symmetric, no bruits, no thyromegaly LYMPHATICS:  No cervical, inguinal adenopathy LUNGS:  Clear to auscultation bilaterally BACK:  No CVA tenderness CHEST:  Unremarkable HEART:  PMI not displaced or sustained,S1 and S2 within normal limits, no S3, no S4, no clicks, no rubs, no murmurs ABD:  Flat, positive bowel sounds normal in frequency in pitch, no bruits, no rebound, no guarding, no midline pulsatile mass, no hepatomegaly, no splenomegaly EXT:  2 plus pulses throughout, no edema, no cyanosis no clubbing SKIN:  No rashes no nodules NEURO:  Cranial nerves II through XII grossly intact, motor grossly intact throughout PSYCH:  Cognitively intact, oriented to person place and time    EKG:  EKG is ordered today. The ekg ordered today demonstrates sinus rhythm, rate 86, axis within normal limits, no acute ST-T wave changes.   Recent Labs: No results found for requested labs within last 8760 hours.    Lipid Panel No results found for:  CHOL, TRIG, HDL, CHOLHDL, VLDL, LDLCALC, LDLDIRECT    Wt Readings from Last 3 Encounters:  01/26/19 221 lb 9.6 oz (100.5 kg)      Other studies Reviewed: Additional studies/ records that were reviewed today include: EKGs and records from his primary care.. Review of the above records demonstrates:  Please see elsewhere in the note.     ASSESSMENT AND PLAN:  ABNORMAL EKG: The patient has a physical with normal and no cardiovascular symptoms.  I reviewed the EKGs from the outside hospital and there was poor anterior R wave progression.  However, on our EKG this is not evident he has a normal R wave progression.  He would have no reason to have had an anterior  myocardial infarction he has no symptoms consistent with this.  His physical exam is unremarkable.  No further testing is suggested.   Current medicines are reviewed at length with the patient today.  The patient does not have concerns regarding medicines.  The following changes have been made:  no change  Labs/ tests ordered today include: None  Orders Placed This Encounter  Procedures  . EKG 12-Lead     Disposition:   FU with me as needed.      Signed, Rollene Rotunda, MD  01/26/2019 6:03 PM    [facility]

## 2019-01-25 NOTE — Telephone Encounter (Signed)
FAXED NOTES TO [facility] 

## 2019-01-25 NOTE — Telephone Encounter (Signed)
NOTES ON FILE FROM [facility] [phone], SENT REFERRAL TO SCHEDULING 

## 2019-01-26 ENCOUNTER — Encounter: Payer: Self-pay | Admitting: Cardiology

## 2019-01-26 ENCOUNTER — Other Ambulatory Visit: Payer: Self-pay

## 2019-01-26 ENCOUNTER — Telehealth: Payer: Self-pay | Admitting: Cardiology

## 2019-01-26 ENCOUNTER — Encounter: Payer: Self-pay | Admitting: *Deleted

## 2019-01-26 ENCOUNTER — Ambulatory Visit (INDEPENDENT_AMBULATORY_CARE_PROVIDER_SITE_OTHER): Payer: Commercial Managed Care - PPO | Admitting: Cardiology

## 2019-01-26 VITALS — BP 123/79 | HR 91 | Temp 98.4°F | Ht 74.0 in | Wt 221.6 lb

## 2019-01-26 DIAGNOSIS — R9431 Abnormal electrocardiogram [ECG] [EKG]: Secondary | ICD-10-CM | POA: Diagnosis not present

## 2019-01-26 NOTE — Patient Instructions (Signed)
Medication Instructions:  Your physician recommends that you continue on your current medications as directed. Please refer to the Current Medication list given to you today.  If you need a refill on your cardiac medications before your next appointment, please call your pharmacy.   Lab work: NONE  Testing/Procedures: NONE  Follow-Up: At [facility], you and your health needs are our priority.  As part of our continuing mission to provide you with exceptional heart care, we have created designated Provider Care Teams.  These Care Teams include your primary Cardiologist (physician) and Advanced Practice Providers (APPs -  Physician Assistants and Nurse Practitioners) who all work together to provide you with the care you need, when you need it. You may see Dr Hochrein or one of the following Advanced Practice Providers on your designated Care Team:    Rhonda Barrett, PA-C  Kathryn Lawrence, DNP, ANP  Cadence Furth, NP    Your physician wants you to follow-up as needed.     

## 2019-01-26 NOTE — Telephone Encounter (Signed)
Patient was in today and forgot to ask for a work release letter. IF the office could fax it to his wife's work. The fax number is [phone]

## 2019-01-26 NOTE — Telephone Encounter (Signed)
Left message for patient, letter placed in the mail.

## 2020-09-27 ENCOUNTER — Emergency Department (HOSPITAL_BASED_OUTPATIENT_CLINIC_OR_DEPARTMENT_OTHER)
Admission: EM | Admit: 2020-09-27 | Discharge: 2020-09-27 | Disposition: A | Discharge status: Home / Self Care | Payer: Commercial Managed Care - PPO | Attending: Emergency Medicine | Admitting: Emergency Medicine

## 2020-09-27 ENCOUNTER — Encounter (HOSPITAL_BASED_OUTPATIENT_CLINIC_OR_DEPARTMENT_OTHER): Payer: Self-pay

## 2020-09-27 ENCOUNTER — Other Ambulatory Visit: Payer: Self-pay

## 2020-09-27 DIAGNOSIS — K4090 Unilateral inguinal hernia, without obstruction or gangrene, not specified as recurrent: Secondary | ICD-10-CM | POA: Diagnosis not present

## 2020-09-27 DIAGNOSIS — R1031 Right lower quadrant pain: Secondary | ICD-10-CM | POA: Diagnosis present

## 2020-09-27 HISTORY — DX: Unilateral inguinal hernia, without obstruction or gangrene, not specified as recurrent: K40.90

## 2020-09-27 NOTE — ED Triage Notes (Signed)
"  Diagnosed with a hernia on right groin a few years ago and never had it treated, now it is starting to bulge and hurt again 2 days ago" per pt No other symptoms

## 2020-09-27 NOTE — Discharge Instructions (Addendum)
Please return to the emergency department if your hernia is stuck out and unable to be reduced as discussed.  As long as hernia is easily reducible he can follow-up with general surgery.  If you feel like hernia is stuck out and you are unable to get it reduced please return for evaluation.

## 2020-09-27 NOTE — ED Notes (Signed)
Patient c/o pain related to inguinal hernia.

## 2020-09-27 NOTE — ED Provider Notes (Signed)
[facility] EMERGENCY DEPARTMENT Provider Note   CSN: [id number] Arrival date & time: 09/27/20  1639     History Chief Complaint  Patient presents with   Abdominal Pain    Ryan Landry is a 45 y.o. male.  The history is provided by the patient.  Abdominal Pain Pain location:  RLQ Pain quality: not aching   Pain radiates to:  Does not radiate Pain severity:  Mild Progression:  Resolved Chronicity:  New Context comment:  Prior right inguinal hernia without repair, lifted heavy object at work today and felt hernia come out, thinks may be back in now Relieved by:  Nothing Worsened by:  Nothing Associated symptoms: no constipation, no dysuria, no fever, no nausea and no vomiting   Risk factors: has not had multiple surgeries       Past Medical History:  Diagnosis Date   Dyslipidemia    Inguinal hernia     There are no problems to display for this patient.   Past Surgical History:  Procedure Laterality Date   Arm surgery     TONSILLECTOMY AND ADENOIDECTOMY         Family History  Problem Relation Age of Onset   COPD Mother    Prostate cancer Father     Social History   Tobacco Use   Smoking status: Never   Smokeless tobacco: Never  Substance Use Topics   Alcohol use: No   Drug use: No    Home Medications Prior to Admission medications   Not on File    Allergies    Patient has no known allergies.  Review of Systems   Review of Systems  Constitutional:  Negative for fever.  Gastrointestinal:  Positive for abdominal pain. Negative for constipation, nausea and vomiting.  Genitourinary:  Negative for difficulty urinating, dysuria, penile pain, testicular pain and urgency.  Neurological:  Negative for weakness.   Physical Exam Updated Vital Signs BP (!) 145/89 (BP Location: Left Arm)   Pulse 68   Temp 98.4 F (36.9 C) (Oral)   Resp 18   Ht 6\' 2"  (1.88 m)   Wt 104.3 kg   SpO2 98%   BMI 29.53 kg/m   Physical  Exam Constitutional:      General: He is not in acute distress.    Appearance: He is not ill-appearing.  HENT:     Head: Normocephalic.     Mouth/Throat:     Mouth: Mucous membranes are moist.  Eyes:     Extraocular Movements: Extraocular movements intact.  Cardiovascular:     Heart sounds: Normal heart sounds.  Abdominal:     General: Abdomen is flat. There is no distension.     Tenderness: There is no abdominal tenderness. There is no guarding or rebound.     Hernia: No hernia is present. There is no hernia in the umbilical area, ventral area, left inguinal area or right inguinal area.  Genitourinary:    Penis: Normal.      Testes: Normal.  Neurological:     Mental Status: He is alert.    ED Results / Procedures / Treatments   Labs (all labs ordered are listed, but only abnormal results are displayed) Labs Reviewed - No data to display  EKG None  Radiology No results found.  Procedures Procedures   Medications Ordered in ED Medications - No data to display  ED Course  I have reviewed the triage vital signs and the nursing notes.  Pertinent labs & imaging results  that were available during my care of the patient were reviewed by me and considered in my medical decision making (see chart for details).    MDM Rules/Calculators/A&P                           Ryan Landry is here with pain in his right groin.  Normal vitals.  No fever.  Diagnosed with a right inguinal hernia few years ago but never had it treated and never had issues with it.  He lifted a heavy object today and he felt a bulge in his right groin.  Feels much better now.  No obvious hernias palpated on exam.  Have very low concern for a strangulated or incarcerated hernia at this time.  Educated patient about hernias and given return precautions.  We will have him follow-up with general surgery.  No concern for appendicitis or other intra-abdominal process as discomfort was in right inguinal area.   Improved now.  Showed patient how to do reduction if it happens again.  Recommend Tylenol and ibuprofen.  Discharged in good condition.  This chart was dictated using voice recognition software.  Despite best efforts to proofread,  errors can occur which can change the documentation meaning.    Final Clinical Impression(s) / ED Diagnoses Final diagnoses:  Right inguinal hernia    Rx / DC Orders ED Discharge Orders     None        Virgina Norfolk, DO 09/27/20 1714

## 2023-04-13 ENCOUNTER — Other Ambulatory Visit: Payer: Self-pay

## 2023-04-13 ENCOUNTER — Other Ambulatory Visit (HOSPITAL_BASED_OUTPATIENT_CLINIC_OR_DEPARTMENT_OTHER): Payer: Self-pay

## 2023-04-13 ENCOUNTER — Encounter (HOSPITAL_BASED_OUTPATIENT_CLINIC_OR_DEPARTMENT_OTHER): Payer: Self-pay | Admitting: Urology

## 2023-04-13 ENCOUNTER — Emergency Department (HOSPITAL_BASED_OUTPATIENT_CLINIC_OR_DEPARTMENT_OTHER)
Admission: EM | Admit: 2023-04-13 | Discharge: 2023-04-13 | Disposition: A | Discharge status: Home / Self Care | Payer: Commercial Managed Care - PPO | Attending: Emergency Medicine | Admitting: Emergency Medicine

## 2023-04-13 ENCOUNTER — Emergency Department (HOSPITAL_BASED_OUTPATIENT_CLINIC_OR_DEPARTMENT_OTHER): Payer: Commercial Managed Care - PPO

## 2023-04-13 DIAGNOSIS — Z79899 Other long term (current) drug therapy: Secondary | ICD-10-CM | POA: Diagnosis not present

## 2023-04-13 DIAGNOSIS — M542 Cervicalgia: Secondary | ICD-10-CM | POA: Insufficient documentation

## 2023-04-13 DIAGNOSIS — Y92410 Unspecified street and highway as the place of occurrence of the external cause: Secondary | ICD-10-CM | POA: Diagnosis not present

## 2023-04-13 DIAGNOSIS — M62830 Muscle spasm of back: Secondary | ICD-10-CM | POA: Diagnosis not present

## 2023-04-13 DIAGNOSIS — M545 Low back pain, unspecified: Secondary | ICD-10-CM | POA: Diagnosis present

## 2023-04-13 MED ORDER — KETOROLAC TROMETHAMINE 60 MG/2ML IM SOLN
60.0000 mg | Freq: Once | INTRAMUSCULAR | Status: AC
  Administered 2023-04-13: 60 mg via INTRAMUSCULAR
  Filled 2023-04-13: qty 2

## 2023-04-13 MED ORDER — CYCLOBENZAPRINE HCL 10 MG PO TABS
10.0000 mg | ORAL_TABLET | Freq: Two times a day (BID) | ORAL | 0 refills | Status: AC | PRN
  Filled 2023-04-13: qty 20, 10d supply, fill #0

## 2023-04-13 MED ORDER — METHYLPREDNISOLONE 4 MG PO TBPK
ORAL_TABLET | ORAL | 0 refills | Status: AC
  Filled 2023-04-13: qty 21, 6d supply, fill #0
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
## 2023-04-13 NOTE — ED Notes (Signed)
 Discharge instructions reviewed with patient. Patient verbalizes understanding, no further questions at this time. Medications/prescriptions and follow up information provided. No acute distress noted at time of departure.

## 2023-04-13 NOTE — Discharge Instructions (Signed)
Recommend 1000 mg of Tylenol every 6 hours as needed for pain.  Take Medrol Dosepak as prescribed.  Take Flexeril as needed for muscle spasms and discomfort as well.  This medication is sedating so please be careful with its use.  Follow-up with your primary care doctor for further pain management.
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
## 2023-04-15 ENCOUNTER — Other Ambulatory Visit (HOSPITAL_BASED_OUTPATIENT_CLINIC_OR_DEPARTMENT_OTHER): Payer: Self-pay
